# Patient Record
Sex: Male | Born: 1970 | Hispanic: Yes | Marital: Married | Smoking: Never smoker
Health system: Southern US, Community
[De-identification: ages and names within clinical notes are randomized; demographics above are authoritative.]

## PROBLEM LIST (undated history)

## (undated) DIAGNOSIS — K859 Acute pancreatitis without necrosis or infection, unspecified: Secondary | ICD-10-CM

## (undated) DIAGNOSIS — I10 Essential (primary) hypertension: Secondary | ICD-10-CM

---

## 2017-02-26 ENCOUNTER — Encounter (HOSPITAL_COMMUNITY): Payer: Self-pay | Admitting: Emergency Medicine

## 2017-02-26 ENCOUNTER — Emergency Department (HOSPITAL_COMMUNITY)
Admission: EM | Admit: 2017-02-26 | Discharge: 2017-02-26 | Disposition: A | Discharge status: Home / Self Care | Payer: Self-pay | Attending: Emergency Medicine | Admitting: Emergency Medicine

## 2017-02-26 DIAGNOSIS — X58XXXA Exposure to other specified factors, initial encounter: Secondary | ICD-10-CM | POA: Insufficient documentation

## 2017-02-26 DIAGNOSIS — Y929 Unspecified place or not applicable: Secondary | ICD-10-CM | POA: Insufficient documentation

## 2017-02-26 DIAGNOSIS — Z23 Encounter for immunization: Secondary | ICD-10-CM | POA: Insufficient documentation

## 2017-02-26 DIAGNOSIS — Y999 Unspecified external cause status: Secondary | ICD-10-CM | POA: Insufficient documentation

## 2017-02-26 DIAGNOSIS — H1089 Other conjunctivitis: Secondary | ICD-10-CM | POA: Insufficient documentation

## 2017-02-26 DIAGNOSIS — S0502XA Injury of conjunctiva and corneal abrasion without foreign body, left eye, initial encounter: Secondary | ICD-10-CM | POA: Insufficient documentation

## 2017-02-26 DIAGNOSIS — Y9389 Activity, other specified: Secondary | ICD-10-CM | POA: Insufficient documentation

## 2017-02-26 DIAGNOSIS — H1033 Unspecified acute conjunctivitis, bilateral: Secondary | ICD-10-CM

## 2017-02-26 MED ORDER — FLUORESCEIN SODIUM 0.6 MG OP STRP
1.0000 | ORAL_STRIP | Freq: Once | OPHTHALMIC | Status: AC
  Administered 2017-02-26: 1 via OPHTHALMIC
  Filled 2017-02-26: qty 1

## 2017-02-26 MED ORDER — ERYTHROMYCIN 5 MG/GM OP OINT
1.0000 "application " | TOPICAL_OINTMENT | Freq: Once | OPHTHALMIC | Status: AC
  Administered 2017-02-26: 1 via OPHTHALMIC
  Filled 2017-02-26: qty 3.5

## 2017-02-26 MED ORDER — DIPHENHYDRAMINE HCL 25 MG PO TABS: 25.0000 mg | ORAL_TABLET | Freq: Four times a day (QID) | ORAL | 0 refills | Status: AC

## 2017-02-26 MED ORDER — TETANUS-DIPHTH-ACELL PERTUSSIS 5-2.5-18.5 LF-MCG/0.5 IM SUSP
0.5000 mL | Freq: Once | INTRAMUSCULAR | Status: AC
  Administered 2017-02-26: 0.5 mL via INTRAMUSCULAR
  Filled 2017-02-26: qty 0.5

## 2017-02-26 NOTE — Discharge Instructions (Signed)
Use the eye ointment 3 times a day for the next week. Call tomorrow to schedule an appointment with the eye doctor. Return here as needed.

## 2017-02-26 NOTE — ED Triage Notes (Signed)
C/o bilateral eye redness and burning x 3 days.  Wife here with same symptoms.

## 2017-02-26 NOTE — ED Provider Notes (Signed)
[facility]-EMERGENCY DEPT Provider Note   CSN: [id number] Arrival date & time: 02/26/17  0012     History   Chief Complaint Chief Complaint  Patient presents with  . Conjunctivitis    HPI Raymond Lynch is a 46 y.o. male who presents to the ED with eye irritation that started 3 days ago and has gotten worse. It started in the left eye after he was cleaning the basement and felt like something in his eye. Then yesterday he started having itching of his right eye and today redness. He reports his wife now has the start of same.   The history is provided by the patient. No language interpreter was used.  Conjunctivitis  This is a new problem. The current episode started more than 2 days ago. The problem occurs constantly. The problem has been gradually worsening. He has tried nothing for the symptoms.    History reviewed. No pertinent past medical history.  There are no active problems to display for this patient.   History reviewed. No pertinent surgical history.     Home Medications    Prior to Admission medications   Medication Sig Start Date End Date Taking? Authorizing Provider  diphenhydrAMINE (BENADRYL) 25 MG tablet Take 1 tablet (25 mg total) by mouth every 6 (six) hours. 02/26/17   Janne NapoleonNeese, Hope M, NP    Family History No family history on file.  Social History Social History  Substance Use Topics  . Smoking status: Never Smoker  . Smokeless tobacco: Never Used  . Alcohol use No     Allergies   Patient has no allergy information on record.   Review of Systems Review of Systems  Constitutional: Negative for chills and fever.  HENT: Negative.   Eyes: Positive for photophobia, discharge, redness and itching.  Respiratory: Negative for cough.   Skin: Negative for rash.  Hematological: Negative for adenopathy.  Psychiatric/Behavioral: The patient is not nervous/anxious.      Physical Exam Updated Vital Signs BP (!) 141/89 (BP Location: Right Arm)    Pulse 98   Temp 98.8 F (37.1 C) (Oral)   Resp 20   SpO2 99%   Physical Exam  Constitutional: He is oriented to person, place, and time. He appears well-developed and well-nourished. No distress.  HENT:  Head: Atraumatic.  Eyes: EOM are normal. Pupils are equal, round, and reactive to light. Lids are everted and swept, no foreign bodies found. Right eye exhibits discharge. Left eye exhibits discharge. Right conjunctiva is injected. Left conjunctiva is injected.  Slit lamp exam:      The left eye shows corneal abrasion and fluorescein uptake.  Neck: Normal range of motion. Neck supple.  Cardiovascular: Normal rate.   Pulmonary/Chest: Effort normal.  Musculoskeletal: Normal range of motion.  Neurological: He is alert and oriented to person, place, and time. No cranial nerve deficit.  Skin: Skin is warm and dry.  Psychiatric: He has a normal mood and affect.  Nursing note and vitals reviewed.    ED Treatments / Results  Labs (all labs ordered are listed, but only abnormal results are displayed) Labs Reviewed - No data to display  Radiology No results found.  Procedures Procedures (including critical care time)  Medications Ordered in ED Medications  erythromycin ophthalmic ointment 1 application (not administered)  fluorescein ophthalmic strip 1 strip (1 strip Both Eyes Given by Other 02/26/17 0244)  Tdap (BOOSTRIX) injection 0.5 mL (0.5 mLs Intramuscular Given 02/26/17 0242)     Initial Impression / Assessment  and Plan / ED Course  I have reviewed the triage vital signs and the nursing notes.   Final Clinical Impressions(s) / ED Diagnoses  46 y.o. male with bilateral eye redness and drainage stable for d/c without fever or visual changes. Will treat with erythromycin opth ointment and he will f/u with the ophthalmologist.he will return here as needed.  Final diagnoses:  Acute bacterial conjunctivitis of both eyes  Abrasion of left cornea, initial encounter    New  Prescriptions New Prescriptions   DIPHENHYDRAMINE (BENADRYL) 25 MG TABLET    Take 1 tablet (25 mg total) by mouth every 6 (six) hours.     Kerrie Buffalo Afton, NP 02/26/17 4098    Devoria Albe, MD 02/26/17 [phone]

## 2017-06-10 ENCOUNTER — Emergency Department (HOSPITAL_COMMUNITY)
Admission: EM | Admit: 2017-06-10 | Discharge: 2017-06-10 | Disposition: A | Discharge status: Home / Self Care | Payer: Self-pay | Attending: Emergency Medicine | Admitting: Emergency Medicine

## 2017-06-10 ENCOUNTER — Emergency Department (HOSPITAL_COMMUNITY): Payer: Self-pay

## 2017-06-10 ENCOUNTER — Encounter (HOSPITAL_COMMUNITY): Payer: Self-pay | Admitting: Emergency Medicine

## 2017-06-10 DIAGNOSIS — R1010 Upper abdominal pain, unspecified: Secondary | ICD-10-CM | POA: Insufficient documentation

## 2017-06-10 DIAGNOSIS — R1110 Vomiting, unspecified: Secondary | ICD-10-CM | POA: Insufficient documentation

## 2017-06-10 DIAGNOSIS — I10 Essential (primary) hypertension: Secondary | ICD-10-CM | POA: Insufficient documentation

## 2017-06-10 HISTORY — DX: Acute pancreatitis without necrosis or infection, unspecified: K85.90

## 2017-06-10 HISTORY — DX: Essential (primary) hypertension: I10

## 2017-06-10 LAB — COMPREHENSIVE METABOLIC PANEL
ALT: 15 U/L — AB (ref 17–63)
ANION GAP: 17 — AB (ref 5–15)
AST: 25 U/L (ref 15–41)
Albumin: 4.6 g/dL (ref 3.5–5.0)
Alkaline Phosphatase: 80 U/L (ref 38–126)
BUN: 9 mg/dL (ref 6–20)
CHLORIDE: 103 mmol/L (ref 101–111)
CO2: 20 mmol/L — AB (ref 22–32)
Calcium: 9.6 mg/dL (ref 8.9–10.3)
Creatinine, Ser: 1.08 mg/dL (ref 0.61–1.24)
GFR calc non Af Amer: >60 mL/min (ref >60)
Glucose, Bld: 152 mg/dL — ABNORMAL HIGH (ref 65–99)
Potassium: 3.1 mmol/L — ABNORMAL LOW (ref 3.5–5.1)
SODIUM: 140 mmol/L (ref 135–145)
Total Bilirubin: 1.3 mg/dL — ABNORMAL HIGH (ref 0.3–1.2)
Total Protein: 8.5 g/dL — ABNORMAL HIGH (ref 6.5–8.1)

## 2017-06-10 LAB — CBC
HCT: 47.9 % (ref 39.0–52.0)
HEMOGLOBIN: 17.4 g/dL — AB (ref 13.0–17.0)
MCH: 31.7 pg (ref 26.0–34.0)
MCHC: 36.3 g/dL — ABNORMAL HIGH (ref 30.0–36.0)
MCV: 87.2 fL (ref 78.0–100.0)
Platelets: 256 10*3/uL (ref 150–400)
RBC: 5.49 MIL/uL (ref 4.22–5.81)
RDW: 12.4 % (ref 11.5–15.5)
WBC: 14.2 10*3/uL — ABNORMAL HIGH (ref 4.0–10.5)

## 2017-06-10 LAB — ETHANOL: Alcohol, Ethyl (B): <10 mg/dL (ref <10)

## 2017-06-10 LAB — LIPASE, BLOOD: LIPASE: 32 U/L (ref 11–51)

## 2017-06-10 MED ORDER — PANTOPRAZOLE SODIUM 40 MG IV SOLR
40.0000 mg | Freq: Once | INTRAVENOUS | Status: AC
  Administered 2017-06-10: 40 mg via INTRAVENOUS
  Filled 2017-06-10: qty 40

## 2017-06-10 MED ORDER — IOPAMIDOL (ISOVUE-300) INJECTION 61%
INTRAVENOUS | Status: AC
  Filled 2017-06-10: qty 100

## 2017-06-10 MED ORDER — SODIUM CHLORIDE 0.9 % IV SOLN: INTRAVENOUS | Status: DC

## 2017-06-10 MED ORDER — FAMOTIDINE 20 MG PO TABS: 20.0000 mg | ORAL_TABLET | Freq: Two times a day (BID) | ORAL | 0 refills | Status: AC

## 2017-06-10 MED ORDER — SUCRALFATE 1 G PO TABS: 1.0000 g | ORAL_TABLET | Freq: Four times a day (QID) | ORAL | 0 refills | Status: AC

## 2017-06-10 MED ORDER — HYDROMORPHONE HCL 1 MG/ML IJ SOLN
0.5000 mg | Freq: Once | INTRAMUSCULAR | Status: AC
  Administered 2017-06-10: 0.5 mg via INTRAVENOUS
  Filled 2017-06-10: qty 1

## 2017-06-10 MED ORDER — POTASSIUM CHLORIDE 10 MEQ/100ML IV SOLN
10.0000 meq | Freq: Once | INTRAVENOUS | Status: AC
Start: 2017-06-10 — End: 2017-06-10
  Administered 2017-06-10: 10 meq via INTRAVENOUS
  Filled 2017-06-10: qty 100

## 2017-06-10 MED ORDER — SUCRALFATE 1 G PO TABS
1.0000 g | ORAL_TABLET | Freq: Once | ORAL | Status: AC
  Administered 2017-06-10: 1 g via ORAL
  Filled 2017-06-10: qty 1

## 2017-06-10 MED ORDER — SODIUM CHLORIDE 0.9 % IV BOLUS (SEPSIS)
1000.0000 mL | Freq: Once | INTRAVENOUS | Status: AC
  Administered 2017-06-10: 1000 mL via INTRAVENOUS

## 2017-06-10 MED ORDER — IOPAMIDOL (ISOVUE-300) INJECTION 61%
100.0000 mL | Freq: Once | INTRAVENOUS | Status: AC | PRN
  Administered 2017-06-10: 100 mL via INTRAVENOUS

## 2017-06-10 MED ORDER — LORAZEPAM 2 MG/ML IJ SOLN
1.0000 mg | Freq: Once | INTRAMUSCULAR | Status: DC
  Filled 2017-06-10: qty 1

## 2017-06-10 MED ORDER — METOCLOPRAMIDE HCL 5 MG/ML IJ SOLN
5.0000 mg | Freq: Once | INTRAMUSCULAR | Status: AC
  Administered 2017-06-10: 5 mg via INTRAVENOUS
  Filled 2017-06-10: qty 2

## 2017-06-10 MED ORDER — GI COCKTAIL ~~LOC~~
30.0000 mL | Freq: Once | ORAL | Status: AC
  Administered 2017-06-10: 30 mL via ORAL
  Filled 2017-06-10: qty 30

## 2017-06-10 NOTE — ED Provider Notes (Signed)
[facility]-EMERGENCY DEPT Provider Note   CSN: [id number] Arrival date & time: 06/10/17  1507     History   Chief Complaint Chief Complaint  Patient presents with  . Abdominal Pain    HPI Raymond Lynch is a 46 y.o. male.  62 -year-old male presents with several day history of abdominal discomfort. Pain is diffuse in nature and associated with nonbilious emesis. He is unsure if she's had fever or chills. Denies any dysuria or hematuria. Mild watery diarrhea noted. Does have a prior history of pancreatitis although he denies any current use of alcohol. Symptoms persistent and worse when he tries to eat. No treatment use prior to arrival.      Past Medical History:  Diagnosis Date  . Hypertension   . Pancreatitis     There are no active problems to display for this patient.   History reviewed. No pertinent surgical history.     Home Medications    Prior to Admission medications   Medication Sig Start Date End Date Taking? Authorizing Provider  diphenhydrAMINE (BENADRYL) 25 MG tablet Take 1 tablet (25 mg total) by mouth every 6 (six) hours. 02/26/17   Janne Napoleon, NP    Family History History reviewed. No pertinent family history.  Social History Social History  Substance Use Topics  . Smoking status: Never Smoker  . Smokeless tobacco: Never Used  . Alcohol use No     Allergies   Patient has no known allergies.   Review of Systems Review of Systems  All other systems reviewed and are negative.    Physical Exam Updated Vital Signs BP (!) 163/109   Pulse 98   Temp 97.6 F (36.4 C) (Oral)   Resp (!) 22   SpO2 100%   Physical Exam  Constitutional: He is oriented to person, place, and time. He appears well-developed and well-nourished.  Non-toxic appearance. No distress.  HENT:  Head: Normocephalic and atraumatic.  Eyes: Pupils are equal, round, and reactive to light. Conjunctivae, EOM and lids are normal.  Neck: Normal range of motion.  Neck supple. No tracheal deviation present. No thyroid mass present.  Cardiovascular: Normal rate, regular rhythm and normal heart sounds.  Exam reveals no gallop.   No murmur heard. Pulmonary/Chest: Effort normal and breath sounds normal. No stridor. No respiratory distress. He has no decreased breath sounds. He has no wheezes. He has no rhonchi. He has no rales.  Abdominal: Soft. Normal appearance and bowel sounds are normal. He exhibits distension. There is generalized tenderness. There is guarding. There is no rigidity, no rebound and no CVA tenderness.  Musculoskeletal: Normal range of motion. He exhibits no edema or tenderness.  Neurological: He is alert and oriented to person, place, and time. He has normal strength. No cranial nerve deficit or sensory deficit. GCS eye subscore is 4. GCS verbal subscore is 5. GCS motor subscore is 6.  Skin: Skin is warm and dry. No abrasion and no rash noted.  Psychiatric: His speech is normal and behavior is normal. His mood appears anxious.  Nursing note and vitals reviewed.    ED Treatments / Results  Labs (all labs ordered are listed, but only abnormal results are displayed) Labs Reviewed  COMPREHENSIVE METABOLIC PANEL - Abnormal; Notable for the following:       Result Value   Potassium 3.1 (*)    CO2 20 (*)    Glucose, Bld 152 (*)    Total Protein 8.5 (*)    ALT 15 (*)  Total Bilirubin 1.3 (*)    Anion gap 17 (*)    All other components within normal limits  CBC - Abnormal; Notable for the following:    WBC 14.2 (*)    Hemoglobin 17.4 (*)    MCHC 36.3 (*)    All other components within normal limits  LIPASE, BLOOD  URINALYSIS, ROUTINE W REFLEX MICROSCOPIC    EKG  EKG Interpretation None       Radiology No results found.  Procedures Procedures (including critical care time)  Medications Ordered in ED Medications  sodium chloride 0.9 % bolus 1,000 mL (not administered)  0.9 %  sodium chloride infusion (not  administered)  LORazepam (ATIVAN) injection 1 mg (not administered)  HYDROmorphone (DILAUDID) injection 0.5 mg (not administered)  metoCLOPramide (REGLAN) injection 5 mg (not administered)     Initial Impression / Assessment and Plan / ED Course  I have reviewed the triage vital signs and the nursing notes.  Pertinent labs & imaging results that were available during my care of the patient were reviewed by me and considered in my medical decision making (see chart for details).     Patient medicated here for pain and anxiety and possible gastritis and he does flow better. Abdominal CT without acute findings. Stable for discharge  Final Clinical Impressions(s) / ED Diagnoses   Final diagnoses:  None    New Prescriptions New Prescriptions   No medications on file     Lorre Nick, MD 06/10/17 2157

## 2017-06-10 NOTE — ED Notes (Signed)
Bed: [facility] Expected date:  Expected time:  Means of arrival:  Comments: HOLD

## 2017-06-10 NOTE — ED Triage Notes (Signed)
Pt found on the floor of the waiting room moaning without shirt and shoes, pt c/o abdominal pain and unable to follow commands at time. Pt reports wife dropped him off at the ED for pancreatitis.

## 2018-10-05 IMAGING — CT CT ABD-PELV W/ CM
2 of 5 series · 16 of 46 positions shown, 18 images · IV contrast (ISOVUE)
Comparison: None.

CLINICAL DATA: 46-year-old male with a history of abdominal pain
and concern for pancreatitis.

EXAM:
CT ABDOMEN AND PELVIS WITH CONTRAST
TECHNIQUE: Multidetector CT imaging of the abdomen and pelvis was performed
using the standard protocol following bolus administration of
intravenous contrast.
CONTRAST:  100mL 5O3IFK-PPP IOPAMIDOL (5O3IFK-PPP) INJECTION 61%

[Series 2: abd/pel with · axial · 0.84mm/px · z∈[-488,-73]mm · 13 of 97 slices shown, 15 images]
[im 7/97  soft-tissue]
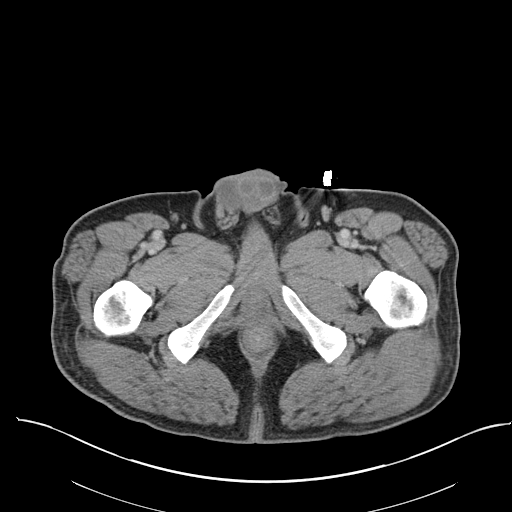
[im 7/97  bone]
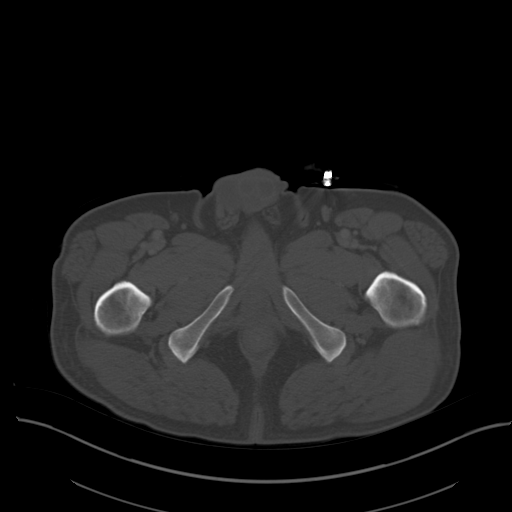
[im 13/97  soft-tissue]
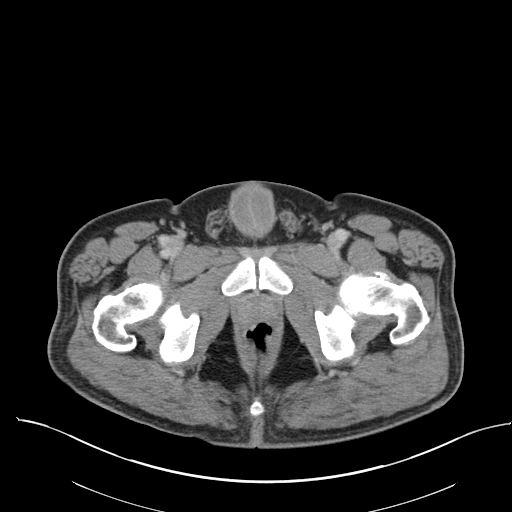
[im 20/97  soft-tissue]
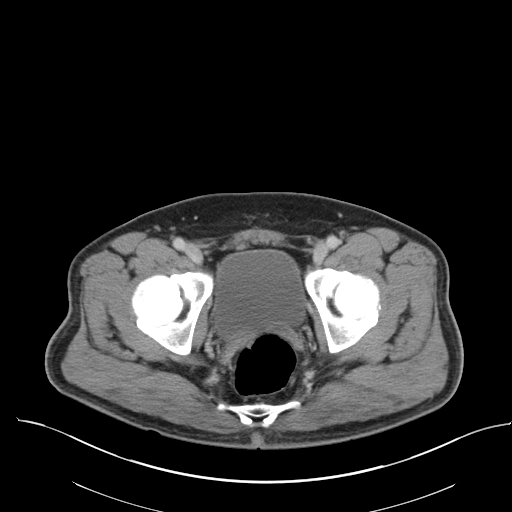
[im 26/97  soft-tissue]
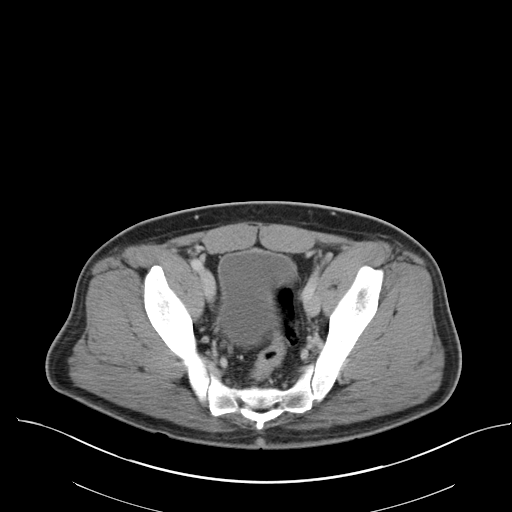
[im 33/97  soft-tissue]
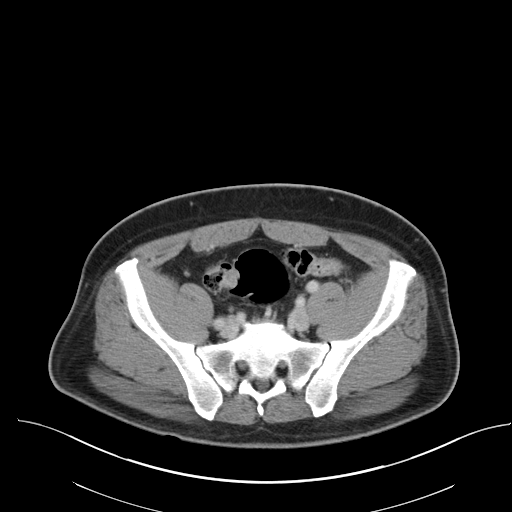
[im 39/97  soft-tissue]
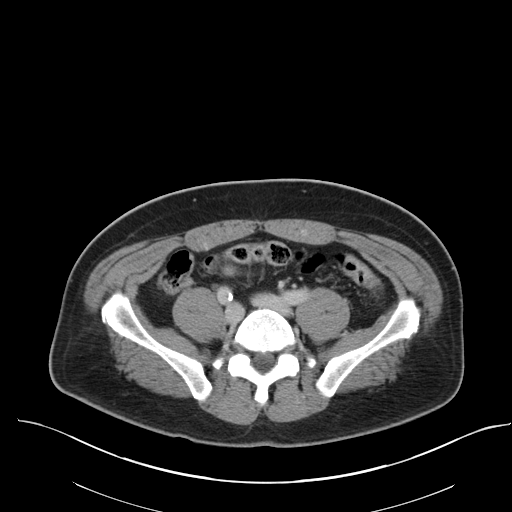
[im 52/97  soft-tissue]
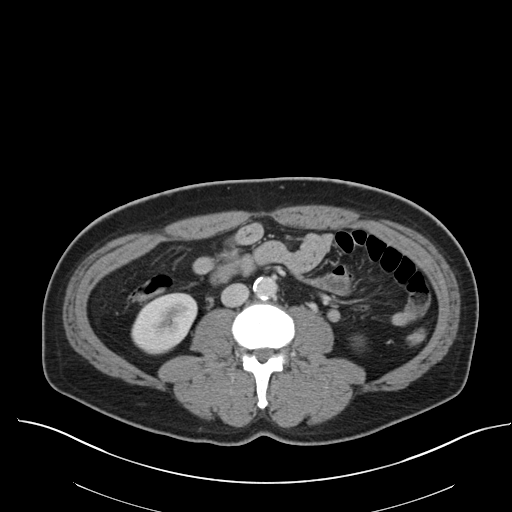
[im 58/97  soft-tissue]
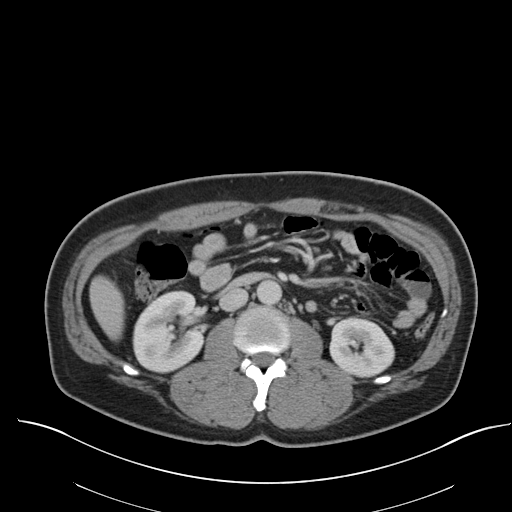
[im 65/97  soft-tissue]
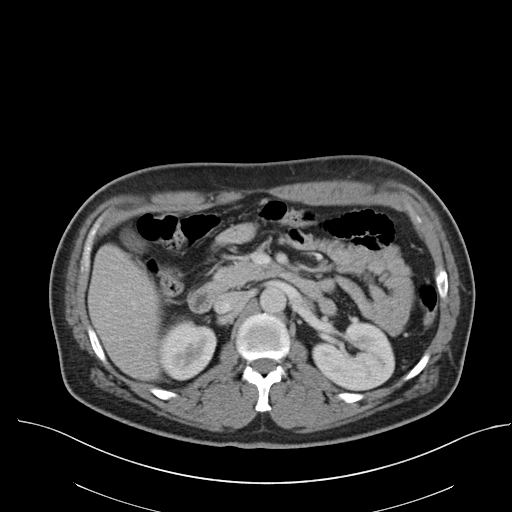
[im 65/97  bone]
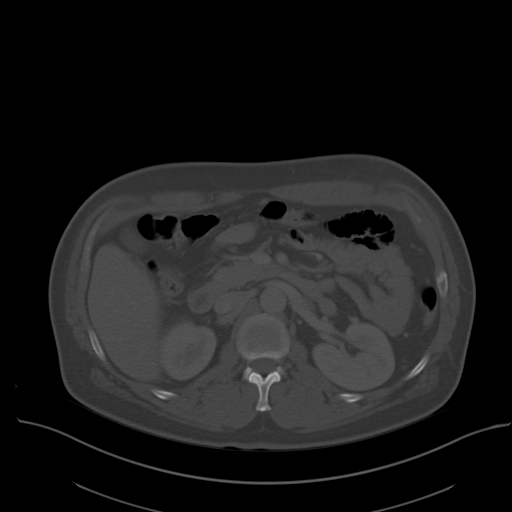
[im 71/97  soft-tissue]
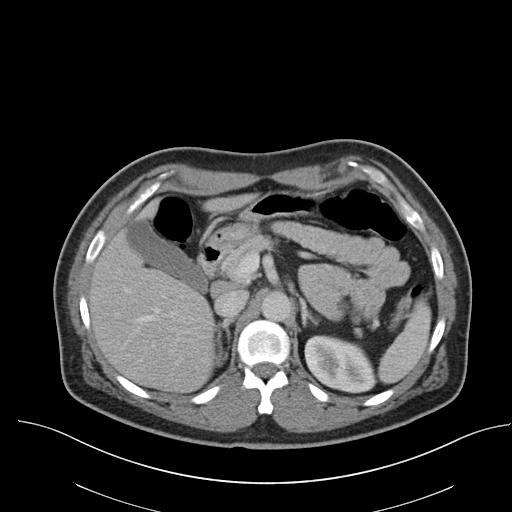
[im 77/97  soft-tissue]
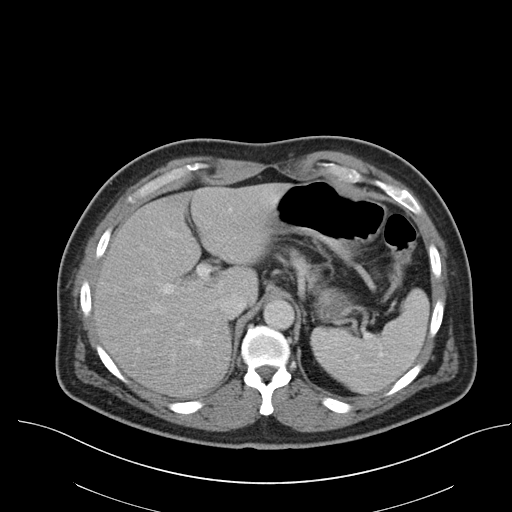
[im 84/97  soft-tissue]
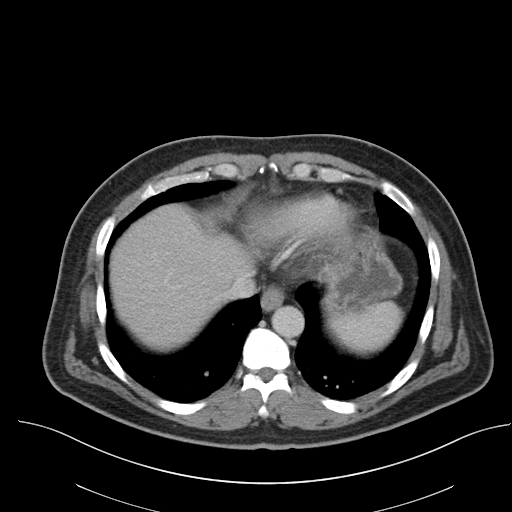
[im 90/97  soft-tissue]
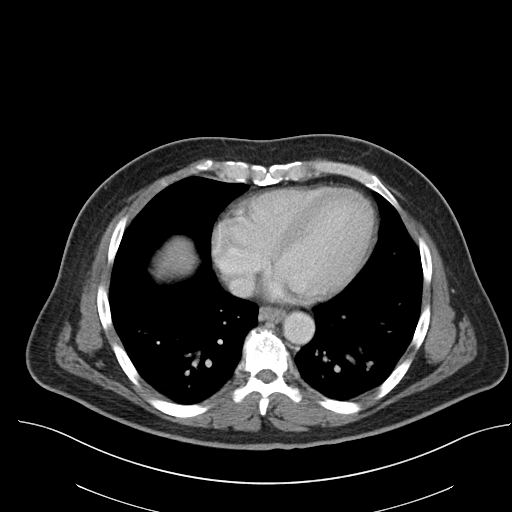

[Series 4: coronal a/|p · coronal · 0.74mm/px · 3 of 144 slices shown]
[im 48/144  soft-tissue]
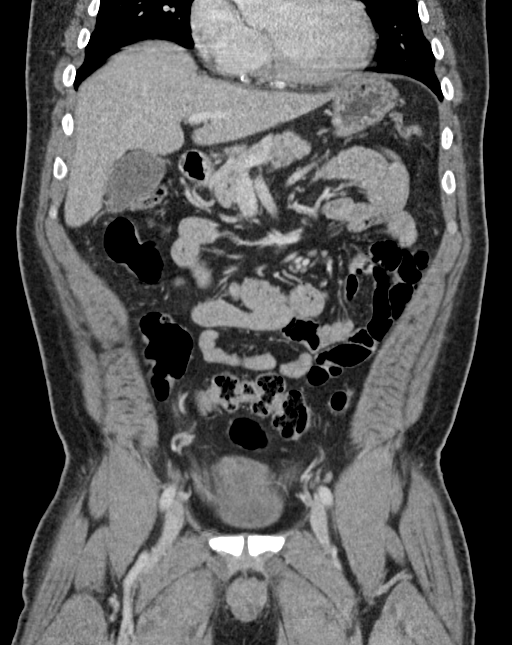
[im 64/144  soft-tissue]
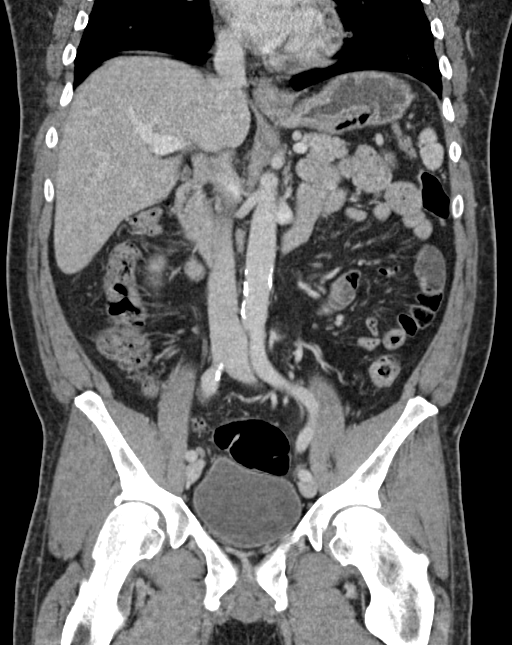
[im 80/144  soft-tissue]
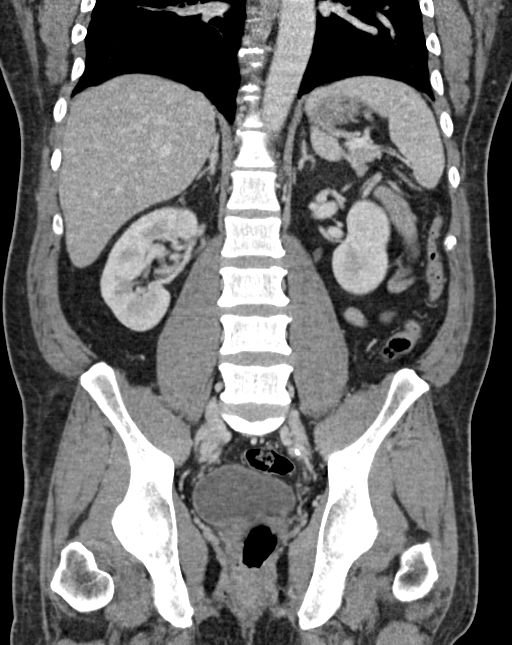

[16 of 46 positions shown; findings below may reference images not displayed]

FINDINGS: Lower chest: No acute abnormality.

Hepatobiliary: No focal liver abnormality is seen. No gallstones,
gallbladder wall thickening, or biliary dilatation.

Pancreas: Unremarkable. No pancreatic ductal dilatation or
surrounding inflammatory changes.

Spleen: Normal in size without focal abnormality.

Adrenals/Urinary Tract: Unremarkable adrenal glands.

Unremarkable bilateral kidneys. No hydronephrosis or
nephrolithiasis.

Unremarkable course the bilateral ureters.

Unremarkable appearance of the urinary bladder.

Stomach/Bowel: Unremarkable stomach. Unremarkable small bowel. No
abnormally distended small bowel or colon. No focal inflammatory
changes. No wall thickening. Appendix is not visualized, however, no
inflammatory changes are present adjacent to the cecum to indicate
an appendicitis.

Colonic diverticulae a without evidence of acute inflammatory
changes.

Vascular/Lymphatic: No adenopathy. Minimal calcifications of the
abdominal aorta. No aneurysm or dissection. Mesenteric vasculature
and renal vasculature patent. Iliac and proximal femoral vasculature
patent.

Reproductive: Unremarkable pelvic structures

Other: No abdominal wall hernia.

Musculoskeletal: No fracture identified. No significant degenerative
changes.
IMPRESSION: No acute CT finding to account for abdominal pain.

Minimal aortic atherosclerosis. Aortic Atherosclerosis
([id number]).

## 2021-12-16 ENCOUNTER — Emergency Department (HOSPITAL_COMMUNITY): Payer: BC Managed Care – PPO

## 2021-12-16 ENCOUNTER — Other Ambulatory Visit: Payer: Self-pay

## 2021-12-16 ENCOUNTER — Emergency Department (HOSPITAL_COMMUNITY)
Admission: EM | Admit: 2021-12-16 | Discharge: 2021-12-17 | Discharge status: Against Medical Advice | Payer: BC Managed Care – PPO | Attending: Emergency Medicine | Admitting: Emergency Medicine

## 2021-12-16 ENCOUNTER — Encounter (HOSPITAL_COMMUNITY): Payer: Self-pay

## 2021-12-16 DIAGNOSIS — F17200 Nicotine dependence, unspecified, uncomplicated: Secondary | ICD-10-CM | POA: Diagnosis not present

## 2021-12-16 DIAGNOSIS — R200 Anesthesia of skin: Secondary | ICD-10-CM | POA: Insufficient documentation

## 2021-12-16 DIAGNOSIS — Z5321 Procedure and treatment not carried out due to patient leaving prior to being seen by health care provider: Secondary | ICD-10-CM | POA: Diagnosis not present

## 2021-12-16 DIAGNOSIS — M79672 Pain in left foot: Secondary | ICD-10-CM | POA: Insufficient documentation

## 2021-12-16 NOTE — ED Triage Notes (Signed)
Patient said he woke up 2 days ago with a swollen left foot. He said he does not remember rolling his ankle or anything. Swollen in the inner side and on top of foot. ?

## 2021-12-16 NOTE — ED Provider Triage Note (Signed)
Emergency Medicine Provider Triage Evaluation Note ? ?Leeon Waylan Boga , a 51 y.o. male  was evaluated in triage.  Pt complains of left foot pain and swelling that started approximately 3 days ago without known injury or trauma.  Pain and swelling noted on the dorsum of the left foot and he also endorses numbness of the foot as well.  Patient is a 1.5 pack/day smoker.  No previous history of blood clots.  He denies fever, chills.  He has no other systemic complaints. ? ?Review of Systems  ?Positive: As above ?Negative:  ?Physical Exam  ?BP (!) 135/97 (BP Location: Left Arm)   Pulse 80   Temp 98.6 ?F (37 ?C) (Oral)   Resp 20   Ht 5\' 9"  (1.753 m)   Wt 72.6 kg   SpO2 100%   BMI 23.63 kg/m?  ?Gen:   Awake, no distress   ?Resp:  Normal effort  ?MSK:   Moves extremities without difficulty; left foot with evidence of chronic dermatitis in the interdigital spaces, there is some swelling of the dorsum of his left foot, mild erythema and tender to the touch.  DP pulses not palpable, however they were noted with Doppler. ?Other:   ? ?Medical Decision Making  ?Medically screening exam initiated at 8:02 PM.  Appropriate orders placed.  Alrick Jamerious Macadams was informed that the remainder of the evaluation will be completed by another provider, this initial triage assessment does not replace that evaluation, and the importance of remaining in the ED until their evaluation is complete. ? ? ?  ?Tonye Pearson, [place] ?12/16/21 2005 ? ?

## 2023-11-03 ENCOUNTER — Emergency Department (HOSPITAL_COMMUNITY): Admission: EM | Admit: 2023-11-03 | Discharge: 2023-11-03 | Disposition: A | Discharge status: Home / Self Care

## 2023-11-03 ENCOUNTER — Emergency Department (HOSPITAL_COMMUNITY)

## 2023-11-03 DIAGNOSIS — M79671 Pain in right foot: Secondary | ICD-10-CM | POA: Diagnosis not present

## 2023-11-03 DIAGNOSIS — M25571 Pain in right ankle and joints of right foot: Secondary | ICD-10-CM | POA: Insufficient documentation

## 2023-11-03 NOTE — Progress Notes (Signed)
 Orthopedic Tech Progress Note Patient Details:  Raymond Lynch 10-05-1970 [id number]  Ortho Devices Type of Ortho Device: ASO Ortho Device/Splint Location: right Ortho Device/Splint Interventions: Ordered, Application, Adjustment   Post Interventions Patient Tolerated: Well Instructions Provided: Adjustment of device, Care of device  Kizzie Fantasia 11/03/2023, 7:41 PM

## 2023-11-03 NOTE — Discharge Instructions (Signed)
 You were seen in the ER today for concerns of ankle and foot pain. Your xrays do not demonstrate any obvious fracture or injury. You were given an ankle brace for additional support. Please follow up with your primary care provider for further evaluation. Return to the ER for any new or worsening symptoms.

## 2023-11-03 NOTE — ED Provider Notes (Signed)
 [place] EMERGENCY DEPARTMENT AT [facility] Provider Note   CSN: [id number] Arrival date & time: 11/03/23  1349     History  Chief Complaint  Patient presents with   Ankle Pain    Raymond Lynch is a 53 y.o. male.  Patient with no significant medical history presents the emergency department with concerns of ankle pain.  Reports that about 3 weeks ago he stepped off a curb and injured his right knee, rolling it.  Denies any recent injury or trauma to the area.  States that he feels that he is having worsening weightbearing capabilities as the time goes on.  Has not followed up or been seen by 1 for evaluation of this injury.  He has been using over-the-counter medications at home without significant pain control.   Ankle Pain      Home Medications Prior to Admission medications   Medication Sig Start Date End Date Taking? Authorizing Provider  diphenhydrAMINE (BENADRYL) 25 MG tablet Take 1 tablet (25 mg total) by mouth every 6 (six) hours. 02/26/17   Janne Napoleon, NP  famotidine (PEPCID) 20 MG tablet Take 1 tablet (20 mg total) by mouth 2 (two) times daily. 06/10/17   Lorre Nick, MD  sucralfate (CARAFATE) 1 g tablet Take 1 tablet (1 g total) by mouth 4 (four) times daily. 06/10/17   Lorre Nick, MD      Allergies    Patient has no known allergies.    Review of Systems   Review of Systems  Musculoskeletal:        Ankle pain   All other systems reviewed and are negative.   Physical Exam Updated Vital Signs BP (!) 145/103 (BP Location: Right Arm)   Pulse (!) 57   Temp 98.3 F (36.8 C) (Oral)   Resp 15   SpO2 95%  Physical Exam Vitals and nursing note reviewed.  Constitutional:      General: He is not in acute distress.    Appearance: He is well-developed.  HENT:     Head: Normocephalic and atraumatic.  Eyes:     General: No scleral icterus.       Right eye: No discharge.        Left eye: No discharge.     Conjunctiva/sclera:  Conjunctivae normal.  Cardiovascular:     Rate and Rhythm: Normal rate and regular rhythm.     Heart sounds: No murmur heard. Pulmonary:     Effort: Pulmonary effort is normal. No respiratory distress.     Breath sounds: Normal breath sounds.  Abdominal:     Palpations: Abdomen is soft.     Tenderness: There is no abdominal tenderness.  Musculoskeletal:        General: Tenderness present. No swelling, deformity or signs of injury.     Cervical back: Neck supple.       Legs:     Comments: TTP along the anterior and medial ankle/foot. No significant erythema, edema, or obvious bony deformity.  No appreciable leg swelling  Skin:    General: Skin is warm and dry.     Capillary Refill: Capillary refill takes less than 2 seconds.     Findings: No rash.  Neurological:     Mental Status: He is alert.  Psychiatric:        Mood and Affect: Mood normal.     ED Results / Procedures / Treatments   Labs (all labs ordered are listed, but only abnormal results are displayed) Labs  Reviewed - No data to display  EKG None  Radiology DG Foot 2 Views Right Result Date: 11/03/2023 CLINICAL DATA:  Stepped off a curb and injured right ankle. EXAM: RIGHT FOOT - 2 VIEW COMPARISON:  Same day ankle radiographs FINDINGS: No acute fracture or dislocation. Calcaneal spurs. Vascular calcifications. IMPRESSION: No acute fracture or dislocation. Electronically Signed   By: Minerva Fester M.D.   On: 11/03/2023 19:51   DG Ankle Complete Right Result Date: 11/03/2023 CLINICAL DATA:  Trauma 3 weeks ago. EXAM: RIGHT ANKLE - COMPLETE 3+ VIEW COMPARISON:  None Available. FINDINGS: Vascular calcifications. No acute fracture or dislocation. Base of fifth metatarsal and talar dome intact. Small Achilles spur. There may be soft tissue swelling distal to the lateral malleolus. IMPRESSION: No acute osseous abnormality. Electronically Signed   By: Jeronimo Greaves M.D.   On: 11/03/2023 17:25    Procedures Procedures     Medications Ordered in ED Medications - No data to display  ED Course/ Medical Decision Making/ A&P                                 Medical Decision Making Amount and/or Complexity of Data Reviewed Radiology: ordered.   This patient presents to the ED for concern of ankle pain.  Differential diagnosis includes ankle fracture, ankle dislocation, midfoot fracture, PAD, DVT   Imaging Studies ordered:  I ordered imaging studies including x-ray of the right ankle, x-ray of the right foot I independently visualized and interpreted imaging which showed right ankle x-ray unremarkable,right foot xray unremarkable I agree with the radiologist interpretation   Problem List / ED Course:  Patient without significant medical history presents emergency department with concerns of ankle pain.  Reportedly about 3 weeks ago he stepped off a curb injuring his right ankle.  Having some difficulty with weightbearing since then.  Denies any significant leg swelling or bony deformity. On exam, there is tenderness palpation along the anterior aspect of the right ankle.  No significant swelling, erythema, or edema present.  Patient is neurovascularly intact.  Palpable pulses at the [facility] and PT sites.  Doubtful of PAD or arterial ischemia given reassuring pulses. Xray imagnig of right ankle and foot negative for any fracture or other abnormality. I suspect there is likely some level of ligamental injury given mechanism of rolling ankle when he stepped off a curb. Will provide ASO brace for added support. Advised close follow up with PCP and weight bearing as tolerated. Return precautions discussed. Patient discharged home in stable condition.   Final Clinical Impression(s) / ED Diagnoses Final diagnoses:  Right foot pain  Acute right ankle pain    Rx / DC Orders ED Discharge Orders     None         Smitty Knudsen, PA-C 11/04/23 2253    Coral Spikes, DO 11/05/23 1556

## 2023-11-03 NOTE — ED Triage Notes (Signed)
 Pt states that approximately three weeks ago he stepped off a curb and injured his R ankle. Pt denies other injury.
